# Patient Record
Sex: Male | Born: 1995 | Race: White | Hispanic: No | Marital: Single | Smoking: Never smoker
Health system: Southern US, Community
[De-identification: ages and names within clinical notes are randomized; demographics above are authoritative.]

---

## 2015-05-12 ENCOUNTER — Emergency Department: Payer: 59

## 2015-05-12 ENCOUNTER — Emergency Department
Admission: EM | Admit: 2015-05-12 | Discharge: 2015-05-13 | Disposition: A | Discharge status: Home / Self Care | Payer: 59 | Attending: Emergency Medicine | Admitting: Emergency Medicine

## 2015-05-12 ENCOUNTER — Encounter: Payer: Self-pay | Admitting: *Deleted

## 2015-05-12 DIAGNOSIS — I10 Essential (primary) hypertension: Secondary | ICD-10-CM | POA: Insufficient documentation

## 2015-05-12 DIAGNOSIS — Z79899 Other long term (current) drug therapy: Secondary | ICD-10-CM | POA: Insufficient documentation

## 2015-05-12 DIAGNOSIS — S022XXA Fracture of nasal bones, initial encounter for closed fracture: Secondary | ICD-10-CM

## 2015-05-12 DIAGNOSIS — K644 Residual hemorrhoidal skin tags: Secondary | ICD-10-CM | POA: Insufficient documentation

## 2015-05-12 DIAGNOSIS — Z792 Long term (current) use of antibiotics: Secondary | ICD-10-CM | POA: Insufficient documentation

## 2015-05-12 DIAGNOSIS — K529 Noninfective gastroenteritis and colitis, unspecified: Secondary | ICD-10-CM | POA: Diagnosis not present

## 2015-05-12 DIAGNOSIS — Z793 Long term (current) use of hormonal contraceptives: Secondary | ICD-10-CM | POA: Insufficient documentation

## 2015-05-12 DIAGNOSIS — Z880 Allergy status to penicillin: Secondary | ICD-10-CM | POA: Insufficient documentation

## 2015-05-12 DIAGNOSIS — R109 Unspecified abdominal pain: Secondary | ICD-10-CM | POA: Diagnosis present

## 2015-05-12 NOTE — ED Notes (Signed)
Pt states he was at Rugby practice and hit another players elbow with his nose. C/o nose pain, possible deformity.

## 2015-05-13 MED ORDER — IBUPROFEN 800 MG PO TABS
ORAL_TABLET | ORAL | Status: AC
  Filled 2015-05-13: qty 1

## 2015-05-13 MED ORDER — IBUPROFEN 800 MG PO TABS: 800.0000 mg | ORAL_TABLET | Freq: Once | ORAL | Status: DC

## 2015-05-13 NOTE — ED Notes (Signed)
MD Brown at bedside, completing medical evaluation.  

## 2015-05-13 NOTE — ED Provider Notes (Signed)
[facility] Emergency Department Provider Note  ____________________________________________  Time seen: 12:05 AM  I have reviewed the triage vital signs and the nursing notes.   HISTORY  Chief Complaint Facial Injury     HPI Alan Fowler is a 19 y.o. male presents with history of actually hitting his nose to another player's elbow today while at rugby practice. Patient currently complains of 3 or 4 out of 10 inferiorand.     Past medical history None There are no active problems to display for this patient.   History reviewed. No pertinent past surgical history.  No current outpatient prescriptions on file.  Allergies Penicillins  No family history on file.  Social History Social History  Substance Use Topics  . Smoking status: Never Smoker   . Smokeless tobacco: None  . Alcohol Use: Yes    Review of Systems  Constitutional: Negative for fever. Eyes: Negative for visual changes. ENT: Negative for sore throat. Positive for nose pain  Cardiovascular: Negative for chest pain. Respiratory: Negative for shortness of breath. Gastrointestinal: Negative for abdominal pain, vomiting and diarrhea. Genitourinary: Negative for dysuria. Musculoskeletal: Negative for back pain. Skin: Negative for rash. Neurological: Negative for headaches, focal weakness or numbness.   10-point ROS otherwise negative.  ____________________________________________   PHYSICAL EXAM:  VITAL SIGNS: ED Triage Vitals  Enc Vitals Group     BP 05/12/15 2150 128/86 mmHg     Pulse Rate 05/12/15 2150 74     Resp 05/12/15 2150 18     Temp 05/12/15 2150 98.4 F (36.9 C)     Temp Source 05/12/15 2150 Axillary     SpO2 05/12/15 2150 99 %     Weight --      Height --      Head Cir --      Peak Flow --      Pain Score 05/12/15 2156 4     Pain Loc --      Pain Edu? --      Excl. in GC? --      Constitutional: Alert and oriented. Well appearing and in  no distress. Eyes: Conjunctivae are normal. PERRL. Normal extraocular movements. ENT   Head: Normocephalic and atraumatic.   Nose: Slight left for deviation nasal septum   Mouth/Throat: Mucous membranes are moist.   Neck: No stridor. Hematological/Lymphatic/Immunilogical: No cervical lymphadenopathy. Cardiovascular: Normal rate, regular rhythm. Normal and symmetric distal pulses are present in all extremities. No murmurs, rubs, or gallops. Respiratory: Normal respiratory effort without tachypnea nor retractions. Breath sounds are clear and equal bilaterally. No wheezes/rales/rhonchi. Gastrointestinal: Soft and nontender. No distention. There is no CVA tenderness. Genitourinary: deferred Musculoskeletal: Nontender with normal range of motion in all extremities. No joint effusions.  No lower extremity tenderness nor edema. Neurologic:  Normal speech and language. No gross focal neurologic deficits are appreciated. Speech is normal.  Skin:  Skin is warm, dry and intact. No rash noted. Psychiatric: Mood and affect are normal. Speech and behavior are normal. Patient exhibits appropriate insight and judgment.  ____________________________________________    RADIOLOGY   DG Nasal Bones (Final result) Result time: 05/12/15 22:29:17   Final result by Rad Results In Interface (05/12/15 22:29:17)   Narrative:   CLINICAL DATA: Nasal pain, trauma to the nose playing rugby  EXAM: NASAL BONES - 3+ VIEW  COMPARISON: None.  FINDINGS: Minimally displaced inferior nasal bone fracture is identified. The vomer is midline. Paranasal sinuses are clear.  IMPRESSION: Minimally displaced inferior nasal bone  fracture.   Electronically Signed By: Christiana Pellant M.D. On: 05/12/2015 22:29      INITIAL IMPRESSION / ASSESSMENT AND PLAN / ED COURSE  Pertinent labs & imaging results that were available during my care of the patient were reviewed by me and considered in my  medical decision making (see chart for details).  Patient given ibuprofen 800 mg recommend face guard during rugby practice and games. Patient will be referred to Dr. Bud Face ENT  ____________________________________________   FINAL CLINICAL IMPRESSION(S) / ED DIAGNOSES  Final diagnoses:  Nasal bone fracture, closed, initial encounter      Darci Current, MD 05/13/15 [phone]

## 2015-05-13 NOTE — Discharge Instructions (Signed)
Nasal Fracture A nasal fracture is a break or crack in the bones of the nose. A minor break usually heals in a month. You often will receive black eyes from a nasal fracture. This is not a cause for concern. The black eyes will go away over 1 to 2 weeks.  DIAGNOSIS  Your caregiver may want to examine you if you are concerned about a fracture of the nose. X-rays of the nose may not show a nasal fracture even when one is present. Sometimes your caregiver must wait 1 to 5 days after the injury to re-check the nose for alignment and to take additional X-rays. Sometimes the caregiver must wait until the swelling has gone down. TREATMENT Minor fractures that have caused no deformity often do not require treatment. More serious fractures where bones are displaced may require surgery. This will take place after the swelling is gone. Surgery will stabilize and align the fracture. HOME CARE INSTRUCTIONS   Put ice on the injured area.  Put ice in a plastic bag.  Place a towel between your skin and the bag.  Leave the ice on for 15-20 minutes, 03-04 times a day.  Take medications as directed by your caregiver.  Only take over-the-counter or prescription medicines for pain, discomfort, or fever as directed by your caregiver.  If your nose starts bleeding, squeeze the soft parts of the nose against the center wall while you are sitting in an upright position for 10 minutes.  Contact sports should be avoided for at least 3 to 4 weeks or as directed by your caregiver. SEEK MEDICAL CARE IF:  Your pain increases or becomes severe.  You continue to have nosebleeds.  The shape of your nose does not return to normal within 5 days.  You have pus draining from the nose. SEEK IMMEDIATE MEDICAL CARE IF:   You have bleeding from your nose that does not stop after 20 minutes of pinching the nostrils closed and keeping ice on the nose.  You have clear fluid draining from your nose.  You notice a grape-like  swelling on the dividing wall between the nostrils (septum). This is a collection of blood (hematoma) that must be drained to help prevent infection.  You have difficulty moving your eyes.  You have recurrent vomiting. Document Released: 08/25/2000 Document Revised: 11/20/2011 Document Reviewed: 12/12/2010 ExitCare Patient Information 2015 [employer]. This information is not intended to replace advice given to you by your health care provider. Make sure you discuss any questions you have with your health care provider.  

## 2015-12-09 ENCOUNTER — Ambulatory Visit (INDEPENDENT_AMBULATORY_CARE_PROVIDER_SITE_OTHER): Payer: Managed Care, Other (non HMO) | Admitting: Sports Medicine

## 2015-12-09 ENCOUNTER — Encounter: Payer: Self-pay | Admitting: Sports Medicine

## 2015-12-09 VITALS — BP 119/61 | HR 67 | Ht 69.0 in | Wt 195.0 lb

## 2015-12-09 DIAGNOSIS — S060X0A Concussion without loss of consciousness, initial encounter: Secondary | ICD-10-CM

## 2015-12-09 NOTE — Progress Notes (Signed)
   Subjective:    Patient ID: Alan Fowler, male    DOB: Feb 20, 1996, 20 y.o.   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 While playing in a rugby game, he took a knee to the head. He denies any loss of consciousness. At the time, he had a feeling of being "slowed down". He also had photophobia, phonophobia, some dizziness, and a mild headache. He was removed from the contest and has not been allowed to return to rugby since. He was evaluated by the athletic trainer at school. He was removed from classes for a total of one week to possibly 1-1/2 weeks. He has now resumed his classes without any problem. He has been asymptomatic now for well over a week. In fact, he was able to go to [place] last week for spring break and did not experience any returning symptoms. He's had two previous concussions. The first one was with football in 2014. The second one was a more recent concussion in November 2016 and this was with playing rugby.  Medical history is reviewed. He is otherwise healthy No medications Allergic to penicillin    Review of Systems  As above    Objective:   Physical Exam  Well-developed, fit appearing. No acute distress. Vital signs reviewed  Neurological exam: Cranial nerves II-12 are grossly intact. There is no gross neurological deficits of either his upper or lower extremity's. Excellent finger to nose. Good balance with single leg, double leg, and tandem stance. No returning symptoms with vertical or horizontal saccades.  ImPACT test reviewed. Patient has returned to baseline, or close to baseline, on all parameters.      Assessment & Plan:  Resolved concussion  I spoke with the athletic trainer at school. Patient has progressed at this point up to noncontact drills. I think he is okay to participate in contact drills the next couple of days and, if  he is asymptomatic, he is cleared to play in the game this Saturday. I did discuss the dangers of repetitive concussions with this athlete. It is my opinion that if he suffers another concussion in the next year or so then he should strongly consider getting up rugby. He understands. Follow-up with me as needed.

## 2016-01-29 IMAGING — CR DG NASAL BONES 3+V
1 series · 3 of 3 positions shown · non-contrast
Comparison: None.

CLINICAL DATA: Nasal pain, trauma to the nose playing rugby

EXAM:
NASAL BONES - 3+ VIEW

[Series 1: dg nasal bones · 0.14mm/px · 3 of 3 slices shown]
[im 1/3]
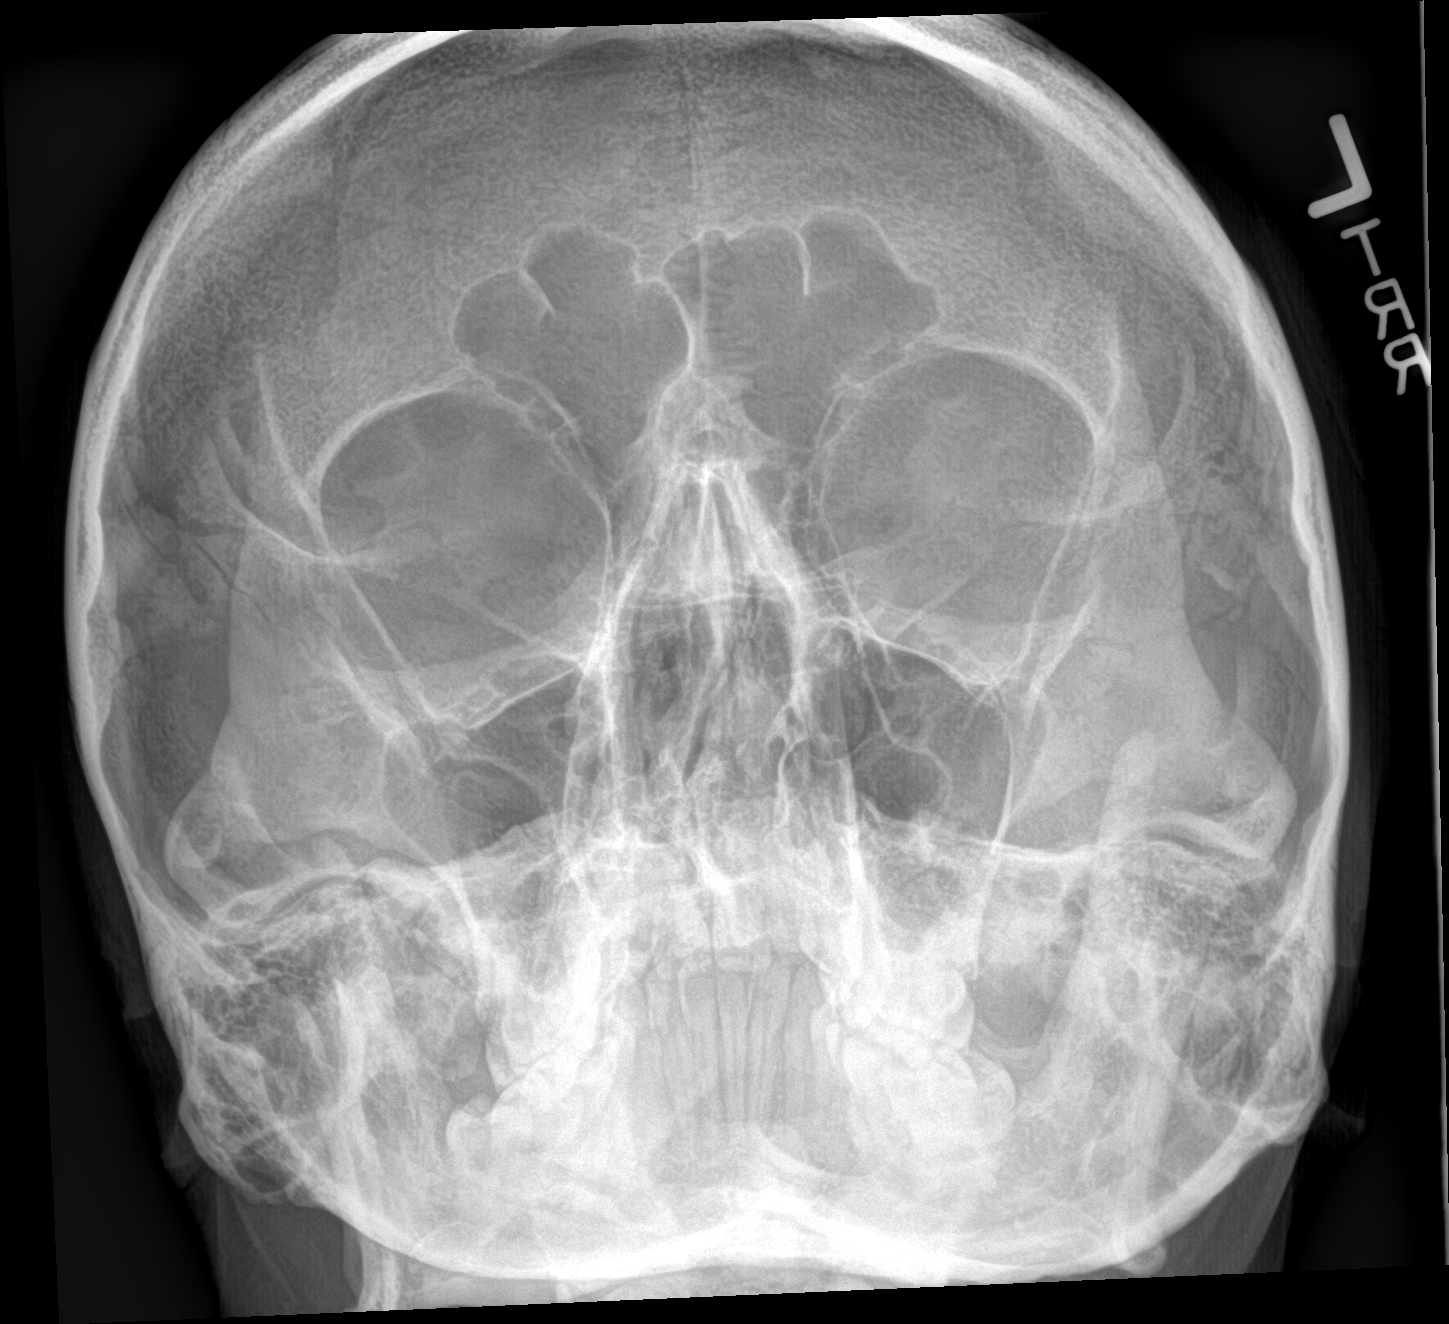
[im 2/3]
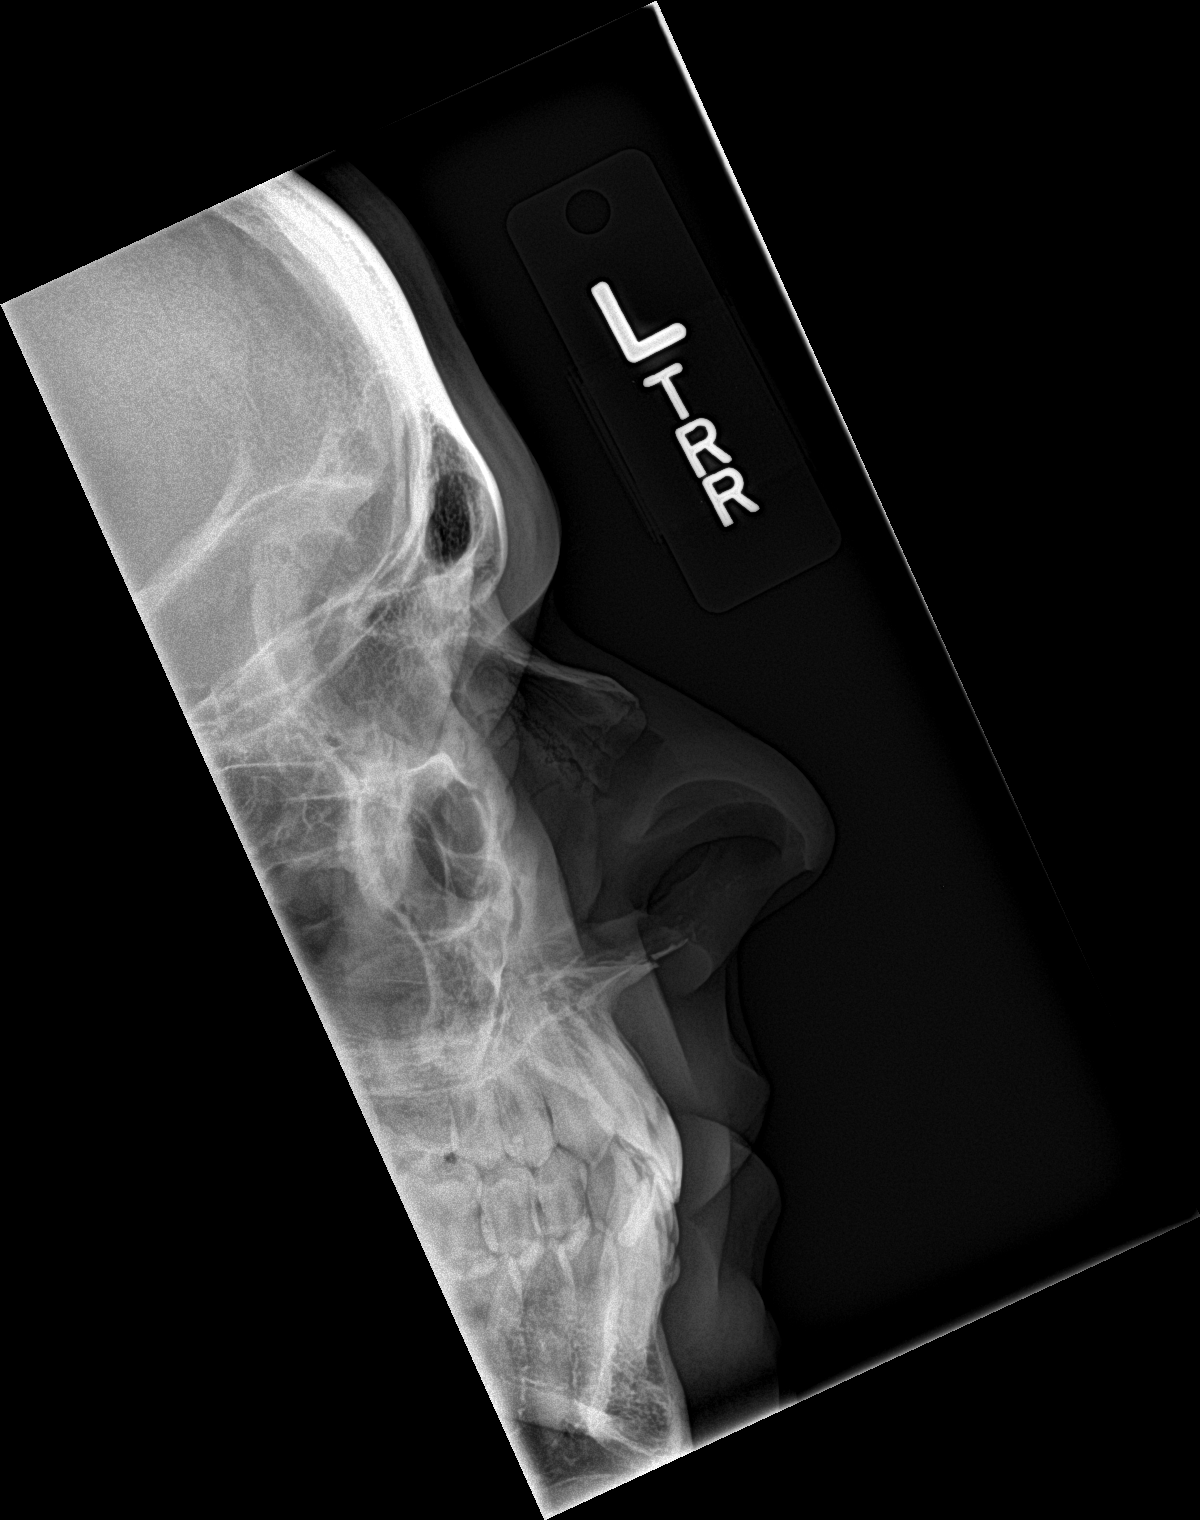
[im 3/3]
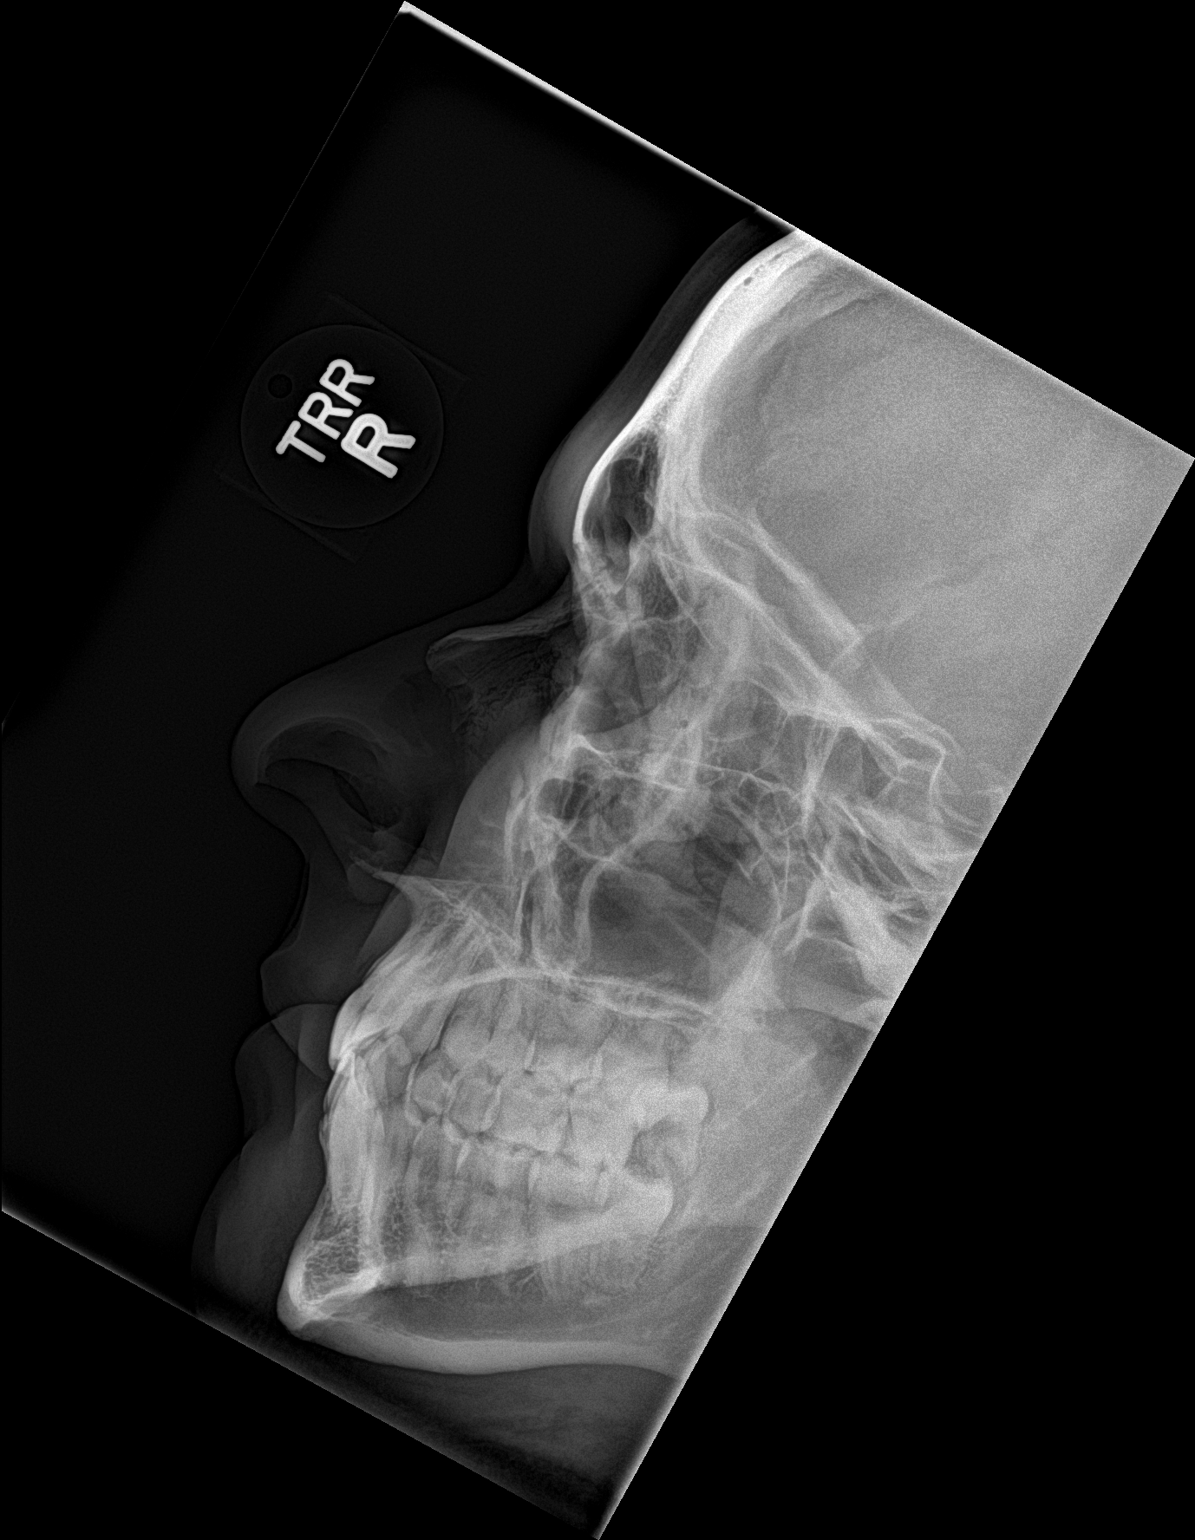

[3 of 3 positions shown; findings below may reference images not displayed]

FINDINGS: Minimally displaced inferior nasal bone fracture is identified. The
vomer is midline. Paranasal sinuses are clear.
IMPRESSION: Minimally displaced inferior nasal bone fracture.

## 2017-05-19 ENCOUNTER — Inpatient Hospital Stay
Admit: 2017-05-19 | Discharge: 2017-05-19 | Disposition: A | Discharge status: Home / Self Care | Payer: PRIVATE HEALTH INSURANCE | Attending: Emergency Medicine

## 2017-05-19 DIAGNOSIS — S0181XA Laceration without foreign body of other part of head, initial encounter: Secondary | ICD-10-CM

## 2017-05-19 MED ORDER — DIPHTH,PERTUS(AC)TETANUS VAC(PF) 2.5 LF UNIT-8 MCG-5 LF/0.5 ML INJ
INTRAMUSCULAR | Status: AC
Start: 2017-05-19 — End: 2017-05-19
  Administered 2017-05-19: 21:00:00 via INTRAMUSCULAR

## 2017-05-19 MED FILL — BOOSTRIX TDAP 2.5 LF UNIT-8 MCG-5 LF/0.5 ML INTRAMUSCULAR SUSPENSION: INTRAMUSCULAR | Qty: 0.5

## 2017-05-19 NOTE — ED Notes (Signed)
I have reviewed discharge instructions with the patient.  The patient verbalized understanding.    Patient left ED via Discharge Method: ambulatory to Home.  Opportunity for questions and clarification provided.       Patient given 0 scripts.         To continue your aftercare when you leave the hospital, you may receive an automated call from our care team to check in on how you are doing.  This is a free service and part of our promise to provide the best care and service to meet your aftercare needs.??? If you have questions, or wish to unsubscribe from this service please call [phone].  Thank you for Choosing our [facility].

## 2017-05-19 NOTE — ED Provider Notes (Signed)
Patient is a 21 y.o. male presenting with skin laceration. The history is provided by the patient.   Laceration    The incident occurred 1 to 2 hours ago. Pain location: right upper eyelid. The laceration is 2 cm in size. The injury mechanism is a blunt object (struck in face wth knee while playing rugby). Foreign body present: no. The pain is at a severity of 4/10. The pain is mild. The pain has been constant since onset. Pertinent negatives include no numbness, no tingling, no weakness and no discoloration. no oral injury no nasal injury or vision changes notedIt is unknown when the patient last had a tetanus shot.        History reviewed. No pertinent past medical history.    History reviewed. No pertinent surgical history.      History reviewed. No pertinent family history.    Social History     Social History   ??? Marital status: SINGLE     Spouse name: N/A   ??? Number of children: N/A   ??? Years of education: N/A     Occupational History   ??? Not on file.     Social History Main Topics   ??? Smoking status: Current Some Day Smoker   ??? Smokeless tobacco: Never Used   ??? Alcohol use Yes   ??? Drug use: No   ??? Sexual activity: Not on file     Other Topics Concern   ??? Not on file     Social History Narrative   ??? No narrative on file         ALLERGIES: Pcn [penicillins]    Review of Systems   Neurological: Negative for tingling, weakness and numbness.   All other systems reviewed and are negative.      Vitals:    05/19/17 1558   BP: 140/79   Pulse: 85   Resp: 18   Temp: 98 ??F (36.7 ??C)   SpO2: 98%   Weight: 93 kg (205 lb)   Height: 5\' 8"  (1.727 m)            Physical Exam   Constitutional: He is oriented to person, place, and time. He appears well-developed and well-nourished. No distress.   HENT:   Head: Normocephalic.   Right Ear: External ear normal.   Left Ear: External ear normal.   Nose: Nose normal.   Mouth/Throat: Oropharynx is clear and moist. No oropharyngeal exudate.    Eyes: Conjunctivae and EOM are normal. Pupils are equal, round, and reactive to light.       No evidence of injury to the globe   Neck: Normal range of motion. Neck supple.   Musculoskeletal: Normal range of motion. He exhibits no edema, tenderness or deformity.   Neurological: He is alert and oriented to person, place, and time.   Skin: Skin is warm and dry.   Psychiatric: He has a normal mood and affect. His behavior is normal.   Nursing note and vitals reviewed.       MDM  Number of Diagnoses or Management Options  Facial laceration, initial encounter:   Risk of Complications, Morbidity, and/or Mortality  Presenting problems: low  Diagnostic procedures: minimal  Management options: low    Patient Progress  Patient progress: improved        ED Course       Wound Repair  Date/Time: 05/19/2017 5:04 PM  Performed by: attendingPreparation: skin prepped with Betadine  Pre-procedure re-eval: Immediately prior to the procedure,  the patient was reevaluated and found suitable for the planned procedure and any planned medications.  Time out: Immediately prior to the procedure a time out was called to verify the correct patient, procedure, equipment, staff and marking as appropriate..  Location details: face  Wound length:2.5 cm or less  Anesthesia: local infiltration    Anesthesia:  Local Anesthetic: lidocaine 1% without epinephrine  Anesthetic total: 1.5 mL  Foreign bodies: no foreign bodies  Irrigation solution: saline  Irrigation method: syringe  Debridement: none  Skin closure: 5-0 nylon  Number of sutures: 7  Technique: running  Dressing: antibiotic ointment  Patient tolerance: Patient tolerated the procedure well with no immediate complications  My total time at bedside, performing this procedure was 1-15 minutes.

## 2017-05-19 NOTE — ED Notes (Signed)
Applied neosporin and a bandaid to patient's right eye brow

## 2017-07-02 ENCOUNTER — Ambulatory Visit (INDEPENDENT_AMBULATORY_CARE_PROVIDER_SITE_OTHER): Payer: Managed Care, Other (non HMO) | Admitting: Sports Medicine

## 2017-07-02 VITALS — BP 120/78 | Ht 69.0 in | Wt 205.0 lb

## 2017-07-02 DIAGNOSIS — S060X0A Concussion without loss of consciousness, initial encounter: Secondary | ICD-10-CM | POA: Diagnosis not present

## 2017-07-02 NOTE — Progress Notes (Signed)
   Subjective:    Patient ID: Alan Fowler, male    DOB: 04-26-1996, 21 y.o.   MRN: [id number]  HPI chief complaint: Concussion  Patient comes in today having suffered his fourth concussion. Injury occurred about 2 weeks ago. While playing rugby he struck his head against another player's head and shoulder area. The following day he had nausea and a headache. Also difficulty sleeping. He had less photophobia and phonophobia then he had on his previous concussions. He missed 3 days of class but has returned to school without any difficulty. He has been symptom free now for over a week. He was evaluated by the athletic trainer at [employer] and comes in today for further evaluation. He does have his IMPACT test results with him.  Interim medical history reviewed Medications reviewed Allergies reviewed     Review of Systems    As above  Objective:   Physical Exam Well-developed, well-nourished. No acute distress. Awake alert and oriented 3. Vital signs reviewed  Neurological exam: Cranial nerves II through XII grossly intact. Strength is 5/5 in both upper and lower extremity's. Excellent finger to nose. No symptoms with horizontal or vertical's saccades. Patient does have a little bit of difficulty with single-leg balance testing but no errors noted with tandem or double leg stance.  Impact test results are available for review in EPIC. He has returned to his baseline       Assessment & Plan:  Concussion  This is Alan Fowler's fourth concussion. I had a long talk with him about continuing with rugby. 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 Although he is a [employer], he's now had 2 years of back-to-back concussions and still has most of the current season to go. He is okay to resume all other activities and will follow-up with me as needed.
